# Patient Record
Sex: Female | Born: 1985 | Race: Black or African American | Hispanic: No | Smoking: Never smoker
Health system: Southern US, Community
[De-identification: ages and names within clinical notes are randomized; demographics above are authoritative.]

---

## 2009-10-08 ENCOUNTER — Observation Stay: Payer: Self-pay | Admitting: Obstetrics & Gynecology

## 2009-12-11 ENCOUNTER — Inpatient Hospital Stay: Payer: Self-pay

## 2009-12-30 ENCOUNTER — Emergency Department: Payer: Self-pay | Admitting: Emergency Medicine

## 2014-06-19 ENCOUNTER — Emergency Department: Payer: Self-pay | Admitting: Emergency Medicine

## 2015-04-01 ENCOUNTER — Encounter: Payer: Self-pay | Admitting: Emergency Medicine

## 2015-04-01 ENCOUNTER — Emergency Department
Admission: EM | Admit: 2015-04-01 | Discharge: 2015-04-01 | Disposition: A | Discharge status: Other Acute Inpatient Hospital | Payer: Self-pay | Attending: Emergency Medicine | Admitting: Emergency Medicine

## 2015-04-01 DIAGNOSIS — Y998 Other external cause status: Secondary | ICD-10-CM | POA: Insufficient documentation

## 2015-04-01 DIAGNOSIS — Y9289 Other specified places as the place of occurrence of the external cause: Secondary | ICD-10-CM | POA: Insufficient documentation

## 2015-04-01 DIAGNOSIS — R4583 Excessive crying of child, adolescent or adult: Secondary | ICD-10-CM | POA: Insufficient documentation

## 2015-04-01 DIAGNOSIS — IMO0002 Reserved for concepts with insufficient information to code with codable children: Secondary | ICD-10-CM

## 2015-04-01 DIAGNOSIS — S50812A Abrasion of left forearm, initial encounter: Secondary | ICD-10-CM | POA: Insufficient documentation

## 2015-04-01 DIAGNOSIS — F329 Major depressive disorder, single episode, unspecified: Secondary | ICD-10-CM | POA: Insufficient documentation

## 2015-04-01 DIAGNOSIS — Z23 Encounter for immunization: Secondary | ICD-10-CM | POA: Insufficient documentation

## 2015-04-01 DIAGNOSIS — F32A Depression, unspecified: Secondary | ICD-10-CM

## 2015-04-01 DIAGNOSIS — Y9389 Activity, other specified: Secondary | ICD-10-CM | POA: Insufficient documentation

## 2015-04-01 DIAGNOSIS — X789XXA Intentional self-harm by unspecified sharp object, initial encounter: Secondary | ICD-10-CM | POA: Insufficient documentation

## 2015-04-01 DIAGNOSIS — Z7289 Other problems related to lifestyle: Secondary | ICD-10-CM

## 2015-04-01 LAB — COMPREHENSIVE METABOLIC PANEL
ALBUMIN: 4.3 g/dL (ref 3.5–5.0)
ALK PHOS: 40 U/L (ref 38–126)
ALT: 13 U/L — ABNORMAL LOW (ref 14–54)
ANION GAP: 11 (ref 5–15)
AST: 24 U/L (ref 15–41)
BUN: 14 mg/dL (ref 6–20)
CALCIUM: 8.8 mg/dL — AB (ref 8.9–10.3)
CHLORIDE: 106 mmol/L (ref 101–111)
CO2: 20 mmol/L — AB (ref 22–32)
Creatinine, Ser: 1.05 mg/dL — ABNORMAL HIGH (ref 0.44–1.00)
GFR calc Af Amer: >60 mL/min (ref >60)
GFR calc non Af Amer: >60 mL/min (ref >60)
GLUCOSE: 95 mg/dL (ref 65–99)
POTASSIUM: 3.3 mmol/L — AB (ref 3.5–5.1)
SODIUM: 137 mmol/L (ref 135–145)
Total Bilirubin: 0.6 mg/dL (ref 0.3–1.2)
Total Protein: 7.7 g/dL (ref 6.5–8.1)

## 2015-04-01 LAB — CBC WITH DIFFERENTIAL/PLATELET
BASOS ABS: 0.1 10*3/uL (ref 0–0.1)
BASOS PCT: 1 %
EOS ABS: 0.2 10*3/uL (ref 0–0.7)
EOS PCT: 2 %
HCT: 36.8 % (ref 35.0–47.0)
HEMOGLOBIN: 12.1 g/dL (ref 12.0–16.0)
LYMPHS ABS: 1.7 10*3/uL (ref 1.0–3.6)
Lymphocytes Relative: 21 %
MCH: 29.5 pg (ref 26.0–34.0)
MCHC: 32.7 g/dL (ref 32.0–36.0)
MCV: 90.3 fL (ref 80.0–100.0)
Monocytes Absolute: 0.6 10*3/uL (ref 0.2–0.9)
Monocytes Relative: 7 %
NEUTROS PCT: 69 %
Neutro Abs: 5.6 10*3/uL (ref 1.4–6.5)
PLATELETS: 370 10*3/uL (ref 150–440)
RBC: 4.08 MIL/uL (ref 3.80–5.20)
RDW: 14.1 % (ref 11.5–14.5)
WBC: 8.3 10*3/uL (ref 3.6–11.0)

## 2015-04-01 LAB — SALICYLATE LEVEL

## 2015-04-01 LAB — ETHANOL: Alcohol, Ethyl (B): 62 mg/dL — ABNORMAL HIGH (ref <5)

## 2015-04-01 LAB — ACETAMINOPHEN LEVEL

## 2015-04-01 MED ORDER — TETANUS-DIPHTH-ACELL PERTUSSIS 5-2.5-18.5 LF-MCG/0.5 IM SUSP
0.5000 mL | Freq: Once | INTRAMUSCULAR | Status: AC
  Administered 2015-04-01: 0.5 mL via INTRAMUSCULAR

## 2015-04-01 MED ORDER — TETANUS-DIPHTH-ACELL PERTUSSIS 5-2.5-18.5 LF-MCG/0.5 IM SUSP
INTRAMUSCULAR | Status: AC
  Filled 2015-04-01: qty 0.5

## 2015-04-01 NOTE — ED Notes (Signed)
BEHAVIORAL HEALTH ROUNDING Patient sleeping: Yes.   Patient alert and oriented: not applicable Behavior appropriate: Yes.    Nutrition and fluids offered: No Toileting and hygiene offered: No Sitter present: q15 minute observations and security camera monitoring Law enforcement present: Yes [employer] 
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
## 2015-04-01 NOTE — ED Notes (Signed)
Noted pt tearful, made MD aware. MD acknowledged and no orders received at this time.
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
## 2015-04-01 NOTE — Discharge Instructions (Signed)
Suicidal Feelings: How to Help Yourself °Suicide is the taking of one's own life. If you feel as though life is getting too tough to handle and are thinking about suicide, get help right away. To get help: °· Call your local emergency services (911 in the [place] °· Call a suicide hotline to speak with a trained counselor who understands how you are feeling. The following is a list of suicide hotlines in the [place]. For a list of hotlines in [place], visit www.suicide.org/hotlines/international/[place]-suicide-hotlines.html. °¨  1-800-273-TALK (1-800-273-8255). °¨  1-800-SUICIDE (1-800-784-2433). °¨  1-888-628-9454. This is a hotline for Spanish speakers. °¨  1-800-799-4TTY (1-800-799-4889). This is a hotline for TTY users. °¨  1-866-4-U-TREVOR (1-866-488-7386). This is a hotline for lesbian, gay, bisexual, transgender, or questioning youth. °· Contact a crisis center or a local suicide prevention center. To find a crisis center or suicide prevention center: °¨ Call your local hospital, clinic, community service organization, mental health center, social service provider, or health department. Ask for assistance in connecting to a crisis center. °¨ Visit www.suicidepreventionlifeline.org/getinvolved/locator for a list of crisis centers in the [place], or visit www.suicideprevention.[place]/thinking-about-suicide/find-a-crisis-centre for a list of centers in [place]. °· Visit the following websites: °¨  National Suicide Prevention Lifeline: www.suicidepreventionlifeline.org °¨  Hopeline: www.hopeline.com °¨  American Foundation for Suicide Prevention: www.afsp.org °¨  The Trevor Project (for lesbian, gay, bisexual, transgender, or questioning youth): www.thetrevorproject.org °HOW CAN I HELP MYSELF FEEL BETTER? °· Promise yourself that you will not do anything drastic when you have suicidal feelings. Remember, there is hope. Many people have gotten through suicidal thoughts and feelings, and you will, too. You may  have gotten through them before, and this proves that you can get through them again. °· Let family, friends, teachers, or counselors know how you are feeling. Try not to isolate yourself from those who care about you. Remember, they will want to help you. Talk with someone every day, even if you do not feel sociable. Face-to-face conversation is best. °· Call a mental health professional and see one regularly. °· Visit your primary health care provider every year. °· Eat a well-balanced diet, and space your meals so you eat regularly. °· Get plenty of rest. °· Avoid alcohol and drugs, and remove them from your home. They will only make you feel worse. °· If you are thinking of taking a lot of medicine, give your medicine to someone who can give it to you one day at a time. If you are on antidepressants and are concerned you will overdose, let your health care provider know so he or she can give you safer medicines. Ask your mental health professional about the possible side effects of any medicines you are taking. °· Remove weapons, poisons, knives, and anything else that could harm you from your home. °· Try to stick to routines. Follow a schedule every day. Put self-care on your schedule. °· Make a list of realistic goals, and cross them off when you achieve them. Accomplishments give a sense of worth. °· Wait until you are feeling better before doing the things you find difficult or unpleasant. °· Exercise if you are able. You will feel better if you exercise for even a half hour each day. °· Go out in the sun or into nature. This will help you recover from depression faster. If you have a favorite place to walk, go there. °· Do the things that have always given you pleasure. Play your favorite music, read a good book, paint a picture, play your favorite instrument, or do anything   else that takes your mind off your depression if it is safe to do. °· Keep your living space well lit. °· When you are feeling well,  write yourself a letter about tips and support that you can read when you are not feeling well. °· Remember that life's difficulties can be sorted out with help. Conditions can be treated. You can work on thoughts and strategies that serve you well. °  °This information is not intended to replace advice given to you by your health care provider. Make sure you discuss any questions you have with your health care provider. °  °Document Released: 11/24/2002 Document Revised: 06/10/2014 Document Reviewed: 09/14/2013 °Elsevier Interactive Patient Education ©2016 [employer] ° °

## 2015-04-01 NOTE — ED Notes (Signed)
Pt noted to be tearful but cooperative on assessment. NAD noted at this time. Pt states that she wants her son and that she doesn't want to be here. This RN explained that the psychiatrist needed to see her first. Pt allowed tech to get vitals then calmly walked back to her room. Pt refused shower, denies needs at this time.

## 2015-04-01 NOTE — ED Notes (Signed)
NAD noted at this time. Pt given phone for 10 min phone call. Pt noted to be alert and oriented at this time. Will continue to monitor with 15 min rounds.

## 2015-04-01 NOTE — ED Provider Notes (Signed)
Patient cleared for discharge by psychiatry Dr. Derrick Ravelhalla  Tara Duquette, MD 04/01/15 [phone]

## 2015-04-01 NOTE — ED Notes (Signed)

## 2015-04-01 NOTE — ED Notes (Signed)
NAD noted at this time. Pt given meal tray. Will continue with 15 min checks.

## 2015-04-01 NOTE — ED Provider Notes (Signed)
-----------------------------------------   7:05 AM on 04/01/2015 -----------------------------------------   Blood pressure 130/82, temperature 98.7 F (37.1 C), temperature source Oral, resp. rate 20, height 5\' 2"  (1.575 m), weight 150 lb (68.04 kg), last menstrual period 03/04/2015, SpO2 97 %.  The patient had no acute events since last update.  Calm and cooperative at this time.  Disposition is pending per Psychiatry/Behavioral Medicine team recommendations.     Loleta Roseory Geoffrey Hynes, MD 04/01/15 [phone]

## 2015-04-01 NOTE — ED Notes (Signed)
NAD noted at this time. Pt resting in bed with eyes closed. Respirations noted to be even and unlabored at this time. Lights are noted to be off and TV is on.

## 2015-04-01 NOTE — Consult Note (Signed)
[facility] [facility] Consult   Reason for Consult:  Follow up Referring Physician:  ER Patient Identification: Tara Ford MRN:  [id number] Principal Diagnosis: <principal problem not specified> Diagnosis:  There are no active problems to display for this patient.   Total Time spent with patient: 45 minutes  Subjective:   Tara Ford is a 29 y.o. female patient who came to Hospital after she was found walking away from home. Pt reports that she is single. And has a child and lives with son and his paternal GM along with her grand daughter and they all live in a 3 BR house and pays money. Pt reports that her mother died in 27-Jul-2007 and does not talk to family and got upset and started crying and walked away because of frustration.Marland Kitchen  HPI:  No previous H/O Inpt to psychiatry. No H/O suicide attempts and not being followed by any MH.  Past Psychiatric History: Alcohol and drugs denied. Denies altercation with cousin.  Risk to Self: Suicidal Ideation: No Suicidal Intent: No Is patient at risk for suicide?: No Suicidal Plan?: No Access to Means: No What has been your use of drugs/alcohol within the last 12 months?: N/A How many times?: 0 Other Self Harm Risks: N/a Triggers for Past Attempts: None known Intentional Self Injurious Behavior: Cutting Comment - Self Injurious Behavior: Pt admits to cutting as a coping mechanism Risk to Others: Homicidal Ideation: No Thoughts of Harm to Others: No Current Homicidal Intent: No Current Homicidal Plan: No Access to Homicidal Means: No Identified Victim: N/A History of harm to others?: No Assessment of Violence: None Noted Violent Behavior Description: N/A Does patient have access to weapons?: Yes (Comment) Criminal Charges Pending?: No Does patient have a court date: No Prior Inpatient Therapy: Prior Inpatient Therapy: No Prior Therapy Dates: N/A Prior Therapy Facilty/Provider(s): N/A Reason for Treatment: N/A Prior Outpatient  Therapy: Prior Outpatient Therapy: No Prior Therapy Dates: N/A Prior Therapy Facilty/Provider(s): N/a Reason for Treatment: N/A Does patient have an ACCT team?: No Does patient have Intensive In-House Services?  : No Does patient have [facility] services? : No Does patient have P4CC services?: No  Past Medical History: History reviewed. No pertinent past medical history. History reviewed. No pertinent past surgical history. Family History: History reviewed. No pertinent family history. Family Psychiatric  History: none Social History:  History  Alcohol Use No     History  Drug Use Not on file    Social History   Social History  . Marital Status: Divorced    Spouse Name: N/A  . Number of Children: N/A  . Years of Education: N/A   Social History Main Topics  . Smoking status: Never Smoker   . Smokeless tobacco: None  . Alcohol Use: No  . Drug Use: None  . Sexual Activity: Not Asked   Other Topics Concern  . None   Social History Narrative  . None   Additional Social History:    History of alcohol / drug use?: No history of alcohol / drug abuse                     Allergies:  No Known Allergies  Labs:  Results for orders placed or performed during the hospital encounter of 04/01/15 (from the past 48 hour(s))  Comprehensive metabolic panel     Status: Abnormal   Collection Time: 04/01/15  1:45 AM  Result Value Ref Range   Sodium 137 135 - 145 mmol/L   Potassium 3.3 (  L) 3.5 - 5.1 mmol/L   Chloride 106 101 - 111 mmol/L   CO2 20 (L) 22 - 32 mmol/L   Glucose, Bld 95 65 - 99 mg/dL   BUN 14 6 - 20 mg/dL   Creatinine, Ser 1.05 (H) 0.44 - 1.00 mg/dL   Calcium 8.8 (L) 8.9 - 10.3 mg/dL   Total Protein 7.7 6.5 - 8.1 g/dL   Albumin 4.3 3.5 - 5.0 g/dL   AST 24 15 - 41 U/L   ALT 13 (L) 14 - 54 U/L   Alkaline Phosphatase 40 38 - 126 U/L   Total Bilirubin 0.6 0.3 - 1.2 mg/dL   GFR calc non Af Amer >60 >60 mL/min   GFR calc Af Amer >60 >60 mL/min    Comment:  (NOTE) The eGFR has been calculated using the CKD EPI equation. This calculation has not been validated in all clinical situations. eGFR's persistently <60 mL/min signify possible Chronic Kidney Disease.    Anion gap 11 5 - 15  Ethanol     Status: Abnormal   Collection Time: 04/01/15  1:45 AM  Result Value Ref Range   Alcohol, Ethyl (B) 62 (H) <5 mg/dL    Comment:        LOWEST DETECTABLE LIMIT FOR SERUM ALCOHOL IS 5 mg/dL FOR MEDICAL PURPOSES ONLY   CBC with Diff     Status: None   Collection Time: 04/01/15  1:45 AM  Result Value Ref Range   WBC 8.3 3.6 - 11.0 K/uL   RBC 4.08 3.80 - 5.20 MIL/uL   Hemoglobin 12.1 12.0 - 16.0 g/dL   HCT 36.8 35.0 - 47.0 %   MCV 90.3 80.0 - 100.0 fL   MCH 29.5 26.0 - 34.0 pg   MCHC 32.7 32.0 - 36.0 g/dL   RDW 14.1 11.5 - 14.5 %   Platelets 370 150 - 440 K/uL   Neutrophils Relative % 69 %   Neutro Abs 5.6 1.4 - 6.5 K/uL   Lymphocytes Relative 21 %   Lymphs Abs 1.7 1.0 - 3.6 K/uL   Monocytes Relative 7 %   Monocytes Absolute 0.6 0.2 - 0.9 K/uL   Eosinophils Relative 2 %   Eosinophils Absolute 0.2 0 - 0.7 K/uL   Basophils Relative 1 %   Basophils Absolute 0.1 0 - 0.1 K/uL  Acetaminophen level     Status: Abnormal   Collection Time: 04/01/15  1:45 AM  Result Value Ref Range   Acetaminophen (Tylenol), Serum <10 (L) 10 - 30 ug/mL    Comment:        THERAPEUTIC CONCENTRATIONS VARY SIGNIFICANTLY. A RANGE OF 10-30 ug/mL MAY BE AN EFFECTIVE CONCENTRATION FOR MANY PATIENTS. HOWEVER, SOME ARE BEST TREATED AT CONCENTRATIONS OUTSIDE THIS RANGE. ACETAMINOPHEN CONCENTRATIONS >150 ug/mL AT 4 HOURS AFTER INGESTION AND >50 ug/mL AT 12 HOURS AFTER INGESTION ARE OFTEN ASSOCIATED WITH TOXIC REACTIONS.   Salicylate level     Status: None   Collection Time: 04/01/15  1:45 AM  Result Value Ref Range   Salicylate Lvl <4.4 2.8 - 30.0 mg/dL    No current facility-administered medications for this encounter.   No current outpatient prescriptions on  file.    Musculoskeletal: Strength & Muscle Tone: within normal limits Gait & Station: normal Patient leans: N/A  Psychiatric Specialty Exam: Review of Systems  All other systems reviewed and are negative.   Blood pressure 120/80, pulse 91, temperature 98.1 F (36.7 C), temperature source Oral, resp. rate 18, height $RemoveBe'5\' 2"'fhNGuMnQo$  (1.575 m),  weight 150 lb (68.04 kg), last menstrual period 03/04/2015, SpO2 100 %.Body mass index is 27.43 kg/(m^2).  General Appearance: Casual  Eye Contact::  Fair  Speech:  Clear and Coherent  Volume:  Normal  Mood:  Anxious  Affect:  Appropriate  Thought Process:  Goal Directed  Orientation:  Full (Time, Place, and Person)  Thought Content:  Rumination  Suicidal Thoughts:  No  Homicidal Thoughts:  No  Memory:  Immediate;   Fair Recent;   Fair Remote;   Fair adequate  Judgement:  Fair  Insight:  Fair  Psychomotor Activity:  Normal  Concentration:  Fair  Recall:  [employer] [facility] [place]  Language: Fair  Akathisia:  No  Handed:  Right  AIMS (if indicated):     Assets:  Communication Skills Desire for Improvement Financial Resources/Insurance Housing Social Support Transportation Others:  family.  ADL's:  Intact  Cognition: WNL  Sleep:      Treatment Plan Summary: Plan D/C IVC and discharge pt home and will keep follow up with local [facility] on 04/03/2015  Disposition: No evidence of imminent risk to self or others at present.    Camie Patience K 04/01/2015 3:20 PM

## 2015-04-01 NOTE — ED Notes (Signed)
Report received from RN Luis [facility].

## 2015-04-01 NOTE — ED Notes (Signed)
NAD noted at this time. Pt resting in bed with her eyes closed, TV on and lights off. Will continue to monitor with 15 min safety checks. Respirations even and unlabored.

## 2015-04-01 NOTE — ED Notes (Signed)
NAD noted at this time. Pt resting in bed with

## 2015-04-01 NOTE — ED Notes (Signed)
Patient assigned to appropriate care area. Patient oriented to unit/care area: Informed that, for their safety, care areas are designed for safety and monitored by security cameras at all times; and visiting hours explained to patient. Patient verbalizes understanding, and verbal contract for safety obtained. 

## 2015-04-01 NOTE — ED Notes (Signed)
NAD noted at this time. Pt resting in bed with the TV on and the lights off. Respirations even and unlabored.

## 2015-04-01 NOTE — ED Notes (Signed)
NAD noted at this time. Pt resting in bed with her eyes open and watching TV. Respirations even and unlabored at this time.

## 2015-04-01 NOTE — ED Notes (Signed)
Pt denies comments/concerns at this time. NAD noted at this time. Pt ambulatory to the lobby at this time.

## 2015-04-01 NOTE — ED Notes (Signed)
NAD noted at this time. Pt resting in bed with her eyes closed. Respirations noted to be even and unlabored at this time.

## 2015-04-01 NOTE — ED Notes (Signed)
BEHAVIORAL HEALTH ROUNDING Patient sleeping: Yes.   Patient alert and oriented: UTA d/t pt still sleeping Behavior appropriate: Yes.  ; If no, describe:  Nutrition and fluids offered: Yes  Toileting and hygiene offered: Yes  Sitter present: yes Law enforcement present: Yes [employer]

## 2015-04-01 NOTE — ED Notes (Signed)
Patient assigned to appropriate care area. Patient oriented to unit/care area: Informed that, for their safety, care areas are designed for safety and monitored by security cameras at all times; and visiting hours explained to patient. Patient verbalizes understanding, and verbal contract for safety obtained.  Pt brought into ED BHU via sally port accompanied by [employer] Luis. Pt wanded with metal detector for safety by O[employer] Designer, industrial/productfficer Angela. Patient oriented to unit/care area: Pt informed of unit policies and procedures.  Informed that, for their safety, care areas are designed for safety and monitored by security cameras at all times; and visiting hours explained to patient. Patient verbalizes understanding, and verbal contract for safety obtained.Pt shown to their room #2.  BEHAVIORAL HEALTH ROUNDING Patient sleeping: No. Patient alert and oriented: yes Behavior appropriate: Yes.   Nutrition and fluids offered: Yes  Toileting and hygiene offered: Yes  Sitter present: q15 min observations and security camera monitoring Law enforcement present: Yes [employer]   ENVIRONMENTAL ASSESSMENT Potentially harmful objects out of patient reach: Yes.   Personal belongings secured: Yes.   Patient dressed in hospital provided attire only: Yes.   Plastic bags out of patient reach: Yes.   Patient care equipment removed: Yes.   Equipment and supplies removed: Yes.   Potentially toxic materials out of patient reach: Yes.   Sharps container removed or out of patient reach: Yes.

## 2015-04-01 NOTE — ED Notes (Signed)
Pt in room. No complaints or concerns voiced at this time. No abnormal behavior noted at this time. Will continue to monitor with q15 min checks and security camera monitoring. ODS officer in area. 

## 2015-04-01 NOTE — ED Notes (Signed)
ED BHU PLACEMENT JUSTIFICATION Is the patient under IVC or is there intent for IVC: Yes.   Is the patient medically cleared: Yes.   Is there vacancy in the ED BHU: Yes.   Is the population mix appropriate for patient: Yes.   Is the patient awaiting placement in inpatient or outpatient setting: No. Has the patient had a psychiatric consult: No. Survey of unit performed for contraband, proper placement and condition of furniture, tampering with fixtures in bathroom, shower, and each patient room: Yes.  ; Findings:  APPEARANCE/BEHAVIOR Pt noted to be tearful but cooperative on assessment. NEURO ASSESSMENT Orientation: time, place and person Hallucinations: No.None noted (Hallucinations) Speech: Normal Gait: normal RESPIRATORY ASSESSMENT Normal expansion.  Clear to auscultation.  No rales, rhonchi, or wheezing. CARDIOVASCULAR ASSESSMENT regular rate and rhythm, S1, S2 normal, no murmur, click, rub or gallop GASTROINTESTINAL ASSESSMENT soft, nontender, BS WNL, no r/g EXTREMITIES normal strength, tone, and muscle mass PLAN OF CARE Provide calm/safe environment. Vital signs assessed twice daily. ED BHU Assessment once each 12-hour shift. Collaborate with intake RN daily or as condition indicates. Assure the ED provider has rounded once each shift. Provide and encourage hygiene. Provide redirection as needed. Assess for escalating behavior; address immediately and inform ED provider.  Assess family dynamic and appropriateness for visitation as needed: Yes.  ; If necessary, describe findings:  Educate the patient/family about BHU procedures/visitation: Yes.  ; If necessary, describe findings:

## 2015-10-31 ENCOUNTER — Emergency Department
Admission: EM | Admit: 2015-10-31 | Discharge: 2015-10-31 | Disposition: A | Discharge status: Home / Self Care | Payer: Self-pay | Attending: Emergency Medicine | Admitting: Emergency Medicine

## 2015-10-31 ENCOUNTER — Encounter: Payer: Self-pay | Admitting: Medical Oncology

## 2015-10-31 ENCOUNTER — Emergency Department: Payer: Self-pay

## 2015-10-31 DIAGNOSIS — R0789 Other chest pain: Secondary | ICD-10-CM | POA: Insufficient documentation

## 2015-10-31 DIAGNOSIS — R079 Chest pain, unspecified: Secondary | ICD-10-CM

## 2015-10-31 LAB — BASIC METABOLIC PANEL
ANION GAP: 7 (ref 5–15)
BUN: 13 mg/dL (ref 6–20)
CALCIUM: 9 mg/dL (ref 8.9–10.3)
CO2: 25 mmol/L (ref 22–32)
Chloride: 105 mmol/L (ref 101–111)
Creatinine, Ser: 0.81 mg/dL (ref 0.44–1.00)
GFR calc Af Amer: >60 mL/min (ref >60)
Glucose, Bld: 77 mg/dL (ref 65–99)
POTASSIUM: 3.3 mmol/L — AB (ref 3.5–5.1)
SODIUM: 137 mmol/L (ref 135–145)

## 2015-10-31 LAB — CBC
HEMATOCRIT: 34.9 % — AB (ref 35.0–47.0)
HEMOGLOBIN: 11.7 g/dL — AB (ref 12.0–16.0)
MCH: 30.3 pg (ref 26.0–34.0)
MCHC: 33.6 g/dL (ref 32.0–36.0)
MCV: 90.2 fL (ref 80.0–100.0)
Platelets: 371 10*3/uL (ref 150–440)
RBC: 3.87 MIL/uL (ref 3.80–5.20)
RDW: 14.3 % (ref 11.5–14.5)
WBC: 7.8 10*3/uL (ref 3.6–11.0)

## 2015-10-31 LAB — TROPONIN I

## 2015-10-31 MED ORDER — PANTOPRAZOLE SODIUM 20 MG PO TBEC: 20.0000 mg | DELAYED_RELEASE_TABLET | Freq: Every day | ORAL | Status: AC

## 2015-10-31 MED ORDER — ONDANSETRON HCL 4 MG PO TABS: 4.0000 mg | ORAL_TABLET | Freq: Three times a day (TID) | ORAL | Status: AC | PRN

## 2015-10-31 NOTE — ED Notes (Signed)
Pt reports she began having left sided chest heaviness only when taking breath in about 2 hrs pta. Denies sob.

## 2015-10-31 NOTE — ED Provider Notes (Signed)
Time Seen: Approximately 1850  I have reviewed the triage notes  Chief Complaint: Chest Pain   History of Present Illness: Tara Ford is a 30 y.o. female *who presents with some nausea and some chest discomfort and started this afternoon. Patient states she is feeling somewhat better now than what she had had previously. She states the pain in her chest is more toward the upper left side. She only has it with inspiration. She denies any cough or true shortness of breath. She had some nausea but no persistent vomiting. She denies any significant pulmonary emboli risk factors  History reviewed. No pertinent past medical history.  There are no active problems to display for this patient.   History reviewed. No pertinent past surgical history.  History reviewed. No pertinent past surgical history.  Current Outpatient Rx  Name  Route  Sig  Dispense  Refill  . ondansetron (ZOFRAN) 4 MG tablet   Oral   Take 1 tablet (4 mg total) by mouth every 8 (eight) hours as needed for nausea or vomiting.   12 tablet   1   . pantoprazole (PROTONIX) 20 MG tablet   Oral   Take 1 tablet (20 mg total) by mouth daily.   30 tablet   1     Allergies:  Review of patient's allergies indicates no known allergies.  Family History: No family history on file.  Social History: Social History  Substance Use Topics  . Smoking status: Never Smoker   . Smokeless tobacco: None  . Alcohol Use: No     Review of Systems:   10 point review of systems was performed and was otherwise negative:  Constitutional: No fever Eyes: No visual disturbances ENT: No sore throat, ear pain Cardiac: No Current chest pain Respiratory: No shortness of breath, wheezing, or stridor Abdomen: No abdominal pain, no vomiting, No diarrhea Endocrine: No weight loss, No night sweats Extremities: No peripheral edema, cyanosis Skin: No rashes, easy bruising Neurologic: No focal weakness, trouble with speech or  swollowing Urologic: No dysuria, Hematuria, or urinary frequency   Physical Exam:  ED Triage Vitals  Enc Vitals Group     BP 10/31/15 1616 125/58 mmHg     Pulse Rate 10/31/15 1616 67     Resp 10/31/15 1616 16     Temp 10/31/15 1616 98.7 F (37.1 C)     Temp Source 10/31/15 1616 Oral     SpO2 10/31/15 1616 98 %     Weight 10/31/15 1614 165 lb (74.844 kg)     Height 10/31/15 1614  (1.575 m)     Head Cir --      Peak Flow --      Pain Score 10/31/15 1615 6     Pain Loc --      Pain Edu? --      Excl. in GC? --     General: Awake , Alert , and Oriented times 3; GCS 15 Head: Normal cephalic , atraumatic Eyes: Pupils equal , round, reactive to light Nose/Throat: No nasal drainage, patent upper airway without erythema or exudate.  Neck: Supple, Full range of motion, No anterior adenopathy or palpable thyroid masses Lungs: Clear to ascultation without wheezes , rhonchi, or rales Heart: Regular rate, regular rhythm without murmurs , gallops , or rubs Abdomen: Tender in the epigastric area without rebound, guarding , or rigidity; bowel sounds positive and symmetric in all 4 quadrants. No organomegaly .        Extremities:  2 plus symmetric pulses. No edema, clubbing or cyanosis Neurologic: normal ambulation, Motor symmetric without deficits, sensory intact Skin: warm, dry, no rashes   Labs:   All laboratory work was reviewed including any pertinent negatives or positives listed below:  Labs Reviewed  BASIC METABOLIC PANEL - Abnormal; Notable for the following:    Potassium 3.3 (*)    All other components within normal limits  CBC - Abnormal; Notable for the following:    Hemoglobin 11.7 (*)    HCT 34.9 (*)    All other components within normal limits  TROPONIN I  Laboratory work was reviewed and showed no clinically significant abnormalities.   EKG:  ED ECG REPORT I, Jennye MoccasinBrian S Morrissa Shein, the attending physician, personally viewed and interpreted this ECG.  Date:  10/31/2015 EKG Time: 1617 Rate 62 Rhythm: normal sinus rhythm QRS Axis: normal Intervals: normal ST/T Wave abnormalities: normal Conduction Disturbances: none Narrative Interpretation: unremarkable *Normal EKG   Radiology:       DG Chest 2 View (Final result) Result time: 10/31/15 16:50:56   Final result by Rad Results In Interface (10/31/15 16:50:56)   Narrative:   CLINICAL DATA: Left-sided chest pain, worse with inhalation.  EXAM: CHEST 2 VIEW  COMPARISON: None.  FINDINGS: Normal heart size and mediastinal contours. No acute infiltrate or edema. No effusion or pneumothorax. No osseous findings.  IMPRESSION: Negative chest.   Electronically Signed By: Marnee SpringJonathon Watts M.D. On: 10/31/2015 16:50          I personally reviewed the radiologic studies   P ED Course: * Differential includes all life-threatening causes for chest pain. This includes but is not exclusive to acute coronary syndrome, aortic dissection, pulmonary embolism, cardiac tamponade, community-acquired pneumonia, pericarditis, musculoskeletal chest wall pain, etc. Given the patient's current clinical presentation and objective findings I felt that this may be some reflux as she seemed to have some tenderness in the epigastric area. I felt this was unlikely to be a life-threatening cause for her chest discomfort and the patient was prescribed Zofran and an antacid medication on an outpatient basis.    Assessment: * Chest pain syndrome   Final Clinical Impression:   Final diagnoses:  Chest pain syndrome     Plan: Outpatient management Patient was advised to return immediately if condition worsens. Patient was advised to follow up with their primary care physician or other specialized physicians involved in their outpatient care. The patient and/or family member/power of attorney had laboratory results reviewed at the bedside. All questions and concerns were addressed and  appropriate discharge instructions were distributed by the nursing staff.            Jennye MoccasinBrian S Lera Gaines, MD 10/31/15 2213

## 2015-10-31 NOTE — ED Notes (Signed)
Pt presents with nausea since this morning and chest pains that began this afternoon. Pt took aspirin 81 mg before coming and states pain has somewhat subsided. States chest hurts in upper left side, only upon inspiration. Denies cough. Pt states nausea is still present, but somewhat resolved. Pt has been sleeping most of the day. Alert & oriented with NAD noted.

## 2016-11-18 ENCOUNTER — Emergency Department
Admission: EM | Admit: 2016-11-18 | Discharge: 2016-11-18 | Disposition: A | Discharge status: Home / Self Care | Payer: Self-pay | Attending: Emergency Medicine | Admitting: Emergency Medicine

## 2016-11-18 ENCOUNTER — Encounter: Payer: Self-pay | Admitting: Emergency Medicine

## 2016-11-18 ENCOUNTER — Emergency Department: Payer: Self-pay

## 2016-11-18 DIAGNOSIS — R52 Pain, unspecified: Secondary | ICD-10-CM

## 2016-11-18 DIAGNOSIS — Y9389 Activity, other specified: Secondary | ICD-10-CM | POA: Insufficient documentation

## 2016-11-18 DIAGNOSIS — S0083XA Contusion of other part of head, initial encounter: Secondary | ICD-10-CM | POA: Insufficient documentation

## 2016-11-18 DIAGNOSIS — W19XXXA Unspecified fall, initial encounter: Secondary | ICD-10-CM

## 2016-11-18 DIAGNOSIS — Y999 Unspecified external cause status: Secondary | ICD-10-CM | POA: Insufficient documentation

## 2016-11-18 DIAGNOSIS — W01198A Fall on same level from slipping, tripping and stumbling with subsequent striking against other object, initial encounter: Secondary | ICD-10-CM | POA: Insufficient documentation

## 2016-11-18 DIAGNOSIS — T1490XA Injury, unspecified, initial encounter: Secondary | ICD-10-CM

## 2016-11-18 DIAGNOSIS — Y92009 Unspecified place in unspecified non-institutional (private) residence as the place of occurrence of the external cause: Secondary | ICD-10-CM | POA: Insufficient documentation

## 2016-11-18 LAB — POCT PREGNANCY, URINE: PREG TEST UR: NEGATIVE

## 2016-11-18 MED ORDER — NAPROXEN 500 MG PO TABS: 500.0000 mg | ORAL_TABLET | Freq: Two times a day (BID) | ORAL | 0 refills | Status: DC

## 2016-11-18 NOTE — Discharge Instructions (Signed)
Begin taking naproxen 500 mg twice a day with food. Eat soft foods until you're able to eat regularly to avoid increased pain in your  jaw. Use ice to your jaw as needed for pain. [facility] if any continued problems.

## 2016-11-18 NOTE — ED Triage Notes (Signed)
Patient presents to the ED with jaw pain post fall yesterday.  Patient states she tripped and fell inside and hit her face on the floor.  Patient states she fell around 7pm in the evening.  Patient is tearful during triage.  Details of fall are very vague.

## 2016-11-18 NOTE — ED Notes (Signed)
See triage note  States she fell yesterday  Having pain to left jaw area  Increased pain with opening and closing mouth  Speaking clearly

## 2016-11-18 NOTE — ED Provider Notes (Signed)
[facility] Emergency Department Provider Note  ____________________________________________   First MD Initiated Contact with Patient 11/18/16 [phone]     (approximate)  I have reviewed the triage vital signs and the nursing notes.   HISTORY  Chief Complaint Fall    HPI Tara Ford is a 31 y.o. female is here with complaint of left-sided mandible pain. Patient states that she was rushing around inside her house yesterday and tripped. She states she fell striking her face on the floor. She states this happened around 7 PM yesterday. She denies any head injury or loss of consciousness. She is not taking any over-the-counter medication for pain. She has not been able to eat solid food due to the pain in her jaw. Currently she rates her pain as an 8 out of 10.   History reviewed. No pertinent past medical history.  There are no active problems to display for this patient.   History reviewed. No pertinent surgical history.  Prior to Admission medications   Medication Sig Start Date End Date Taking? Authorizing Provider  naproxen (NAPROSYN) 500 MG tablet Take 1 tablet (500 mg total) by mouth 2 (two) times daily with a meal. 11/18/16   Bridget Hartshorn L, PA-C  pantoprazole (PROTONIX) 20 MG tablet Take 1 tablet (20 mg total) by mouth daily. 10/31/15 10/30/16  Jennye Moccasin, MD    Allergies Patient has no known allergies.  No family history on file.  Social History Social History  Substance Use Topics  . Smoking status: Never Smoker  . Smokeless tobacco: Never Used  . Alcohol use No    Review of Systems Constitutional: No fever/chills Eyes: No visual changes. ENT: Pain left mandible. Cardiovascular: Denies chest pain. Respiratory: Denies shortness of breath. Gastrointestinal:   No nausea, no vomiting. Musculoskeletal: Negative for back pain. Skin: Negative for rash. Neurological: Negative for headaches, focal weakness or  numbness.   ____________________________________________   PHYSICAL EXAM:  VITAL SIGNS: ED Triage Vitals  Enc Vitals Group     BP 11/18/16 0849 125/75     Pulse Rate 11/18/16 0849 91     Resp 11/18/16 0849 18     Temp 11/18/16 0849 98.9 F (37.2 C)     Temp Source 11/18/16 0849 Oral     SpO2 11/18/16 0849 98 %     Weight 11/18/16 0844 156 lb (70.8 kg)     Height 11/18/16 0844 5\' 2"  (1.575 m)     Head Circumference --      Peak Flow --      Pain Score 11/18/16 0844 8     Pain Loc --      Pain Edu? --      Excl. in GC? --     Constitutional: Alert and oriented. Well appearing and in no acute distress. Eyes: Conjunctivae are normal. PERRL. EOMI. Head: Atraumatic. Nose: No congestion/rhinnorhea. Mouth/Throat: Mucous membranes are moist.  Oropharynx non-erythematous. No trauma noted to the teeth. There is moderate tenderness on palpation of the left lateral mandible and patient is unable to open her mouth completely secondary to pain in that area. There is no gross deformity noted and minimal soft tissue swelling. No ecchymosis or abrasions were seen. Neck: No stridor. No cervical tenderness on palpation posteriorly. Range of motion is within normal limits.  Cardiovascular: Normal rate, regular rhythm. Grossly normal heart sounds.  Good peripheral circulation. Respiratory: Normal respiratory effort.  No retractions. Lungs CTAB. Musculoskeletal: Both upper and lower extremities without any difficulty. Normal  gait was noted. Neurologic:  Normal speech and language. No gross focal neurologic deficits are appreciated. No gait instability. Skin:  Skin is warm, dry and intact. No ecchymosis, abrasions or erythema was noted. Psychiatric: Mood and affect are normal. Speech and behavior are normal.  ____________________________________________   LABS (all labs ordered are listed, but only abnormal results are displayed)  Labs Reviewed  POC URINE PREG, ED  POCT PREGNANCY, URINE     RADIOLOGY  Ct Maxillofacial Wo Contrast  Result Date: 11/18/2016 CLINICAL DATA:  Jaw pain after fall yesterday. EXAM: CT MAXILLOFACIAL WITHOUT CONTRAST TECHNIQUE: Multidetector CT imaging of the maxillofacial structures was performed. Multiplanar CT image reconstructions were also generated. A small metallic BB was placed on the right temple in order to reliably differentiate right from left. COMPARISON:  None. FINDINGS: Osseous: No fracture or mandibular dislocation. No destructive process. Orbits: Negative. No traumatic or inflammatory finding. Sinuses: Clear. Soft tissues: Negative. Limited intracranial: No significant or unexpected finding. IMPRESSION: No definite abnormality seen in the maxillofacial region. Electronically Signed   By: Lupita RaiderJames  Green Jr, M.D.   On: 11/18/2016 10:07    ____________________________________________   PROCEDURES  Procedure(s) performed: None  Procedures  Critical Care performed: No  ____________________________________________   INITIAL IMPRESSION / ASSESSMENT AND PLAN / ED COURSE  Pertinent labs & imaging results that were available during my care of the patient were reviewed by me and considered in my medical decision making (see chart for details).  Patient was told that her x-ray did not show any fractures. She was placed on a soft diet until she is able in without any pain. She is given a prescription for naproxen 500 mg twice a day with food. She may use ice to her jaw as needed for discomfort. [facility] if any continued problems.      ____________________________________________   FINAL CLINICAL IMPRESSION(S) / ED DIAGNOSES  Final diagnoses:  Injury  Pain  Contusion of face, initial encounter  Fall in home, initial encounter      NEW MEDICATIONS STARTED DURING THIS VISIT:  Discharge Medication List as of 11/18/2016 10:27 AM    START taking these medications   Details  naproxen  (NAPROSYN) 500 MG tablet Take 1 tablet (500 mg total) by mouth 2 (two) times daily with a meal., Starting Mon 11/18/2016, Print         Note:  This document was prepared using Dragon voice recognition software and may include unintentional dictation errors.    Tommi RumpsSummers, Eddi Hymes L, PA-C 11/18/16 1040    Minna AntisPaduchowski, Kevin, MD 11/18/16 1351

## 2017-06-14 ENCOUNTER — Emergency Department: Payer: Self-pay

## 2017-06-14 ENCOUNTER — Other Ambulatory Visit: Payer: Self-pay

## 2017-06-14 ENCOUNTER — Encounter: Payer: Self-pay | Admitting: Emergency Medicine

## 2017-06-14 ENCOUNTER — Emergency Department
Admission: EM | Admit: 2017-06-14 | Discharge: 2017-06-14 | Disposition: A | Discharge status: Home / Self Care | Payer: Self-pay | Attending: Emergency Medicine | Admitting: Emergency Medicine

## 2017-06-14 DIAGNOSIS — Z79899 Other long term (current) drug therapy: Secondary | ICD-10-CM | POA: Insufficient documentation

## 2017-06-14 DIAGNOSIS — J069 Acute upper respiratory infection, unspecified: Secondary | ICD-10-CM | POA: Insufficient documentation

## 2017-06-14 DIAGNOSIS — R0789 Other chest pain: Secondary | ICD-10-CM | POA: Insufficient documentation

## 2017-06-14 DIAGNOSIS — B9789 Other viral agents as the cause of diseases classified elsewhere: Secondary | ICD-10-CM | POA: Insufficient documentation

## 2017-06-14 MED ORDER — PSEUDOEPH-BROMPHEN-DM 30-2-10 MG/5ML PO SYRP: 10.0000 mL | ORAL_SOLUTION | Freq: Four times a day (QID) | ORAL | 0 refills | Status: AC | PRN

## 2017-06-14 MED ORDER — FLUTICASONE PROPIONATE 50 MCG/ACT NA SUSP: 1.0000 | Freq: Two times a day (BID) | NASAL | 0 refills | Status: AC

## 2017-06-14 NOTE — ED Triage Notes (Signed)
Pt c/o cough and congestion, pt c/o nonproductive cough, runny nose and sneezing.  No OTC medication used no tylenol or ibuprofen taking

## 2017-06-14 NOTE — ED Provider Notes (Signed)
Lurline IdolI, Juanmanuel Marohl, attending physician, personally viewed and interpreted this EKG  EKG Time: 1650 Rate: 60 Rhythm: normal sinus rhythm Axis: normal Intervals: qtc 418 QRS: narrow ST changes: no st elevation Impression: normal ekg    Phineas SemenGoodman, Philipe Laswell, MD 06/14/17 1657

## 2017-06-14 NOTE — ED Notes (Signed)
Pt reports cough x 4 days, reports this morning had increasing chest pain with the cough, states pain is reproducible with deep breathing and coughing. Pt also c/o nasal congestion at this time.

## 2017-06-14 NOTE — ED Provider Notes (Signed)
[facility] Emergency Department Provider Note  ____________________________________________  Time seen: Approximately 4:34 PM  I have reviewed the triage vital signs and the nursing notes.   HISTORY  Chief Complaint Cough and Nasal Congestion    HPI Tara Ford is a 32 y.o. female who presents the emergency department complaining of nasal congestion, malaise, cough, sore throat, chest pain.  Patient reports that symptoms began last night.  This morning, patient woke up and did not "feel good."  Patient has had increased nasal congestion, sore throat, cough today.  Patient reports that when she "got up to go to work" she developed "chest pain".  Patient presents the emergency department at this time.  She has not tried any medications including Tylenol, Motrin, cough and cold medications.  No cardiac history.  No familial history of significant cardiac issues.  Patient reports that pain is more along the anterior chest wall.  She reports it is worse with deep inspiration or expiration or palpation of her chest.  History reviewed. No pertinent past medical history.  There are no active problems to display for this patient.   History reviewed. No pertinent surgical history.  Prior to Admission medications   Medication Sig Start Date End Date Taking? Authorizing Provider  brompheniramine-pseudoephedrine-DM 30-2-10 MG/5ML syrup Take 10 mLs by mouth 4 (four) times daily as needed. 06/14/17   Merwyn Hodapp, Delorise Royals, PA-C  fluticasone (FLONASE) 50 MCG/ACT nasal spray Place 1 spray into both nostrils 2 (two) times daily. 06/14/17   Ermie Glendenning, Delorise Royals, PA-C  naproxen (NAPROSYN) 500 MG tablet Take 1 tablet (500 mg total) by mouth 2 (two) times daily with a meal. 11/18/16   Bridget Hartshorn L, PA-C  pantoprazole (PROTONIX) 20 MG tablet Take 1 tablet (20 mg total) by mouth daily. 10/31/15 10/30/16  Jennye Moccasin, MD    Allergies Patient has no known allergies.  History  reviewed. No pertinent family history.  Social History Social History   Tobacco Use  . Smoking status: Never Smoker  . Smokeless tobacco: Never Used  Substance Use Topics  . Alcohol use: No  . Drug use: No     Review of Systems  Constitutional: No fever/chills Eyes: No visual changes. No discharge ENT: Positive for nasal congestion and sore throat Cardiovascular: Positive chest pain. Respiratory: Positive cough. No SOB. Gastrointestinal: No abdominal pain.  No nausea, no vomiting.   Musculoskeletal: Negative for musculoskeletal pain. Skin: Negative for rash, abrasions, lacerations, ecchymosis. Neurological: Negative for headaches, focal weakness or numbness. 10-point ROS otherwise negative.  ____________________________________________   PHYSICAL EXAM:  VITAL SIGNS: ED Triage Vitals [06/14/17 1551]  Enc Vitals Group     BP 133/73     Pulse Rate 76     Resp 18     Temp 98.7 F (37.1 C)     Temp Source Oral     SpO2 98 %     Weight 150 lb (68 kg)     Height 5\' 2"  (1.575 m)     Head Circumference      Peak Flow      Pain Score 7     Pain Loc      Pain Edu?      Excl. in GC?      Constitutional: Alert and oriented. Well appearing and in no acute distress. Eyes: Conjunctivae are normal. PERRL. EOMI. Head: Atraumatic. ENT:      Ears: EACs and TMs unremarkable bilaterally.      Nose: Moderate congestion/rhinnorhea.  Minutes  are erythematous and edematous.      Mouth/Throat: Mucous membranes are moist.  Oropharynx is erythematous but nonedematous.  Tonsils are mildly erythematous but nonedematous and no exudates.  Uvula is midline. Neck: No stridor.  Supple full range of motion Hematological/Lymphatic/Immunilogical: No cervical lymphadenopathy. Cardiovascular: Normal rate, regular rhythm.  No murmurs, rubs, gallops.  Normal S1 and S2.  Good peripheral circulation. Respiratory: Normal respiratory effort without tachypnea or retractions. Lungs CTAB. Good air entry  to the bases with no decreased or absent breath sounds. Musculoskeletal: Full range of motion to all extremities. No gross deformities appreciated.  Patient is tender to palpation over bilateral margins of the sternum.  Pain is reproduced with palpation.  No palpable abnormality. Neurologic:  Normal speech and language. No gross focal neurologic deficits are appreciated.  Skin:  Skin is warm, dry and intact. No rash noted. Psychiatric: Mood and affect are normal. Speech and behavior are normal. Patient exhibits appropriate insight and judgement.   ____________________________________________   LABS (all labs ordered are listed, but only abnormal results are displayed)  Labs Reviewed - No data to display ____________________________________________  EKG  ED ECG REPORT I, Delorise RoyalsJonathan D Demontay Grantham,  personally viewed and interpreted this ECG.   Date: June 14, 2017  EKG Time: 1650 hrs.  Rate: 60 bpm  Rhythm: normal sinus rhythm, with sinus arrhythmia  Axis: Normal  Intervals:none  ST&T Change: No ST elevations or depressions noted.  Normal EKG.  ____________________________________________  RADIOLOGY Festus BarrenI, Emelynn Rance D Sergio Hobart, personally viewed and evaluated these images (plain radiographs) as part of my medical decision making, as well as reviewing the written report by the radiologist.  Dg Chest 2 View  Result Date: 06/14/2017 CLINICAL DATA:  Cough for 4 days EXAM: CHEST  2 VIEW COMPARISON:  Oct 31, 2015 FINDINGS: The heart size and mediastinal contours are within normal limits. Calcified granuloma is identified in the medial right lung base unchanged. There is no focal infiltrate, pulmonary edema, or pleural effusion. The visualized skeletal structures are unremarkable. IMPRESSION: No active cardiopulmonary disease. Electronically Signed   By: Sherian ReinWei-Chen  Lin M.D.   On: 06/14/2017 17:30    ____________________________________________    PROCEDURES  Procedure(s) performed:     Procedures    Medications - No data to display   ____________________________________________   INITIAL IMPRESSION / ASSESSMENT AND PLAN / ED COURSE  Pertinent labs & imaging results that were available during my care of the patient were reviewed by me and considered in my medical decision making (see chart for details).  Review of the [place] CSRS was performed in accordance of the [facility] prior to dispensing any controlled drugs.  Clinical Course as of Jun 14 1742  Sat Jun 14, 2017  1649 Patient presented to the emergency department with nasal congestion, sore throat, cough, malaise.  Patient also endorsed chest pain with inspiration, expiration, palpation of the anterior chest wall.  At this time, I feel that symptoms are most consistent with viral URI.  Patient has no cardiac history.  EKG and chest x-ray are ordered for evaluation of patient's chest pain complaint.  These returned with reassuring results, I feel there is no indication for further cardiac workup.  [JC]    Clinical Course User Index [JC] Myriah Boggus, Delorise RoyalsJonathan D, PA-C    Patient's diagnosis is consistent with viral respiratory infection and chest wall pain.  Patient presented with cough and pain with inspiration and expiration and palpation of her chest wall.  EKG and chest x-ray revealed no acute  abnormalities.  Symptoms are most consistent with viral respiratory infection with chest wall tenderness.. Patient will be discharged home with prescriptions for Flonase and Bromfed cough syrup.  She is to take Tylenol Motrin at home.. Patient is to follow up with primary care as needed or otherwise directed. Patient is given ED precautions to return to the ED for any worsening or new symptoms.     ____________________________________________  FINAL CLINICAL IMPRESSION(S) / ED DIAGNOSES  Final diagnoses:  Viral upper respiratory tract infection  Chest wall pain      NEW MEDICATIONS STARTED DURING THIS VISIT:  ED  Discharge Orders        Ordered    fluticasone (FLONASE) 50 MCG/ACT nasal spray  2 times daily     06/14/17 1743    brompheniramine-pseudoephedrine-DM 30-2-10 MG/5ML syrup  4 times daily PRN     06/14/17 1743          This chart was dictated using voice recognition software/Dragon. Despite best efforts to proofread, errors can occur which can change the meaning. Any change was purely unintentional.    Racheal Patches, PA-C 06/14/17 1744    Sharman Cheek, MD 06/14/17 [phone]

## 2017-06-14 NOTE — ED Notes (Signed)

## 2018-04-22 IMAGING — CT CT MAXILLOFACIAL W/O CM
3 series · 17 of 47 positions shown, 20 images · non-contrast
Comparison: None.

CLINICAL DATA: Jaw pain after fall yesterday.

EXAM:
CT MAXILLOFACIAL WITHOUT CONTRAST
TECHNIQUE: Multidetector CT imaging of the maxillofacial structures was
performed. Multiplanar CT image reconstructions were also generated.
A small metallic BB was placed on the right temple in order to
reliably differentiate right from left.

[Series 2: max soft · axial · 0.32mm/px · z∈[+77,+211]mm · 11 of 79 slices shown, 14 images]
[im 6/79  brain]
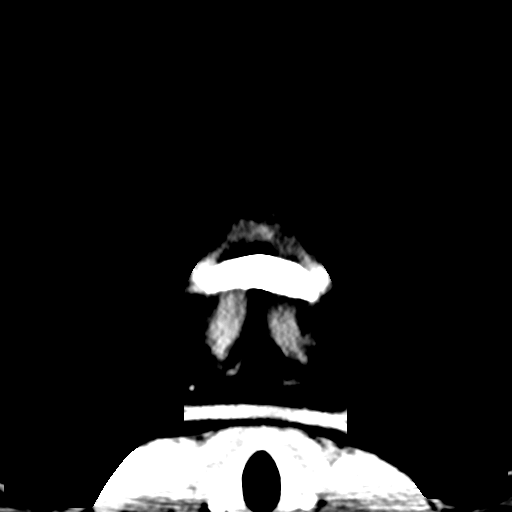
[im 6/79  bone]
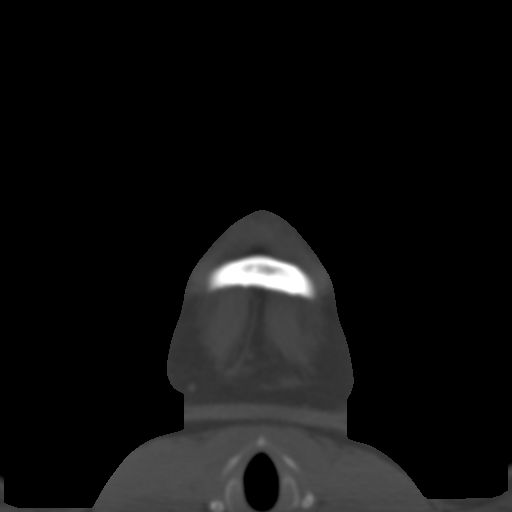
[im 11/79  bone]
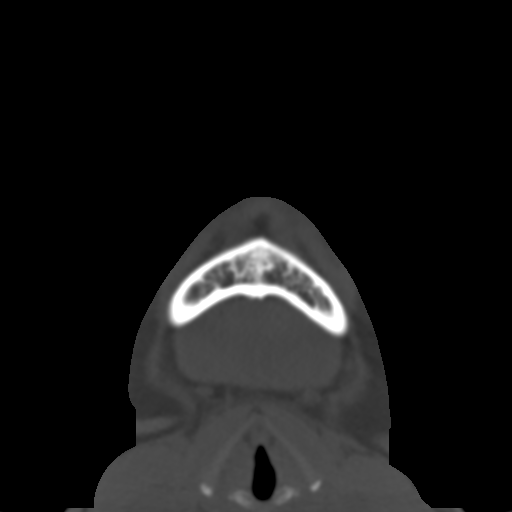
[im 19/79  bone]
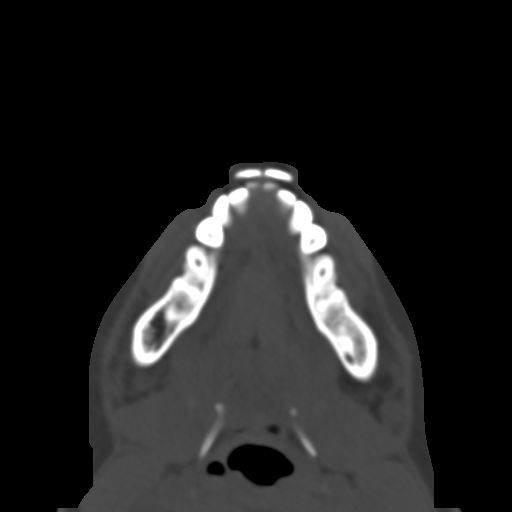
[im 25/79  bone]
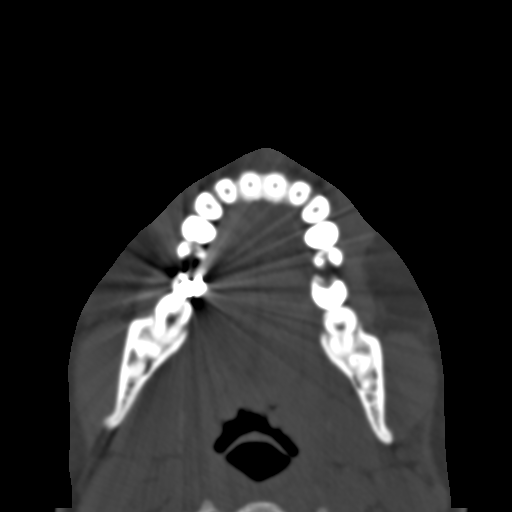
[im 33/79  brain]
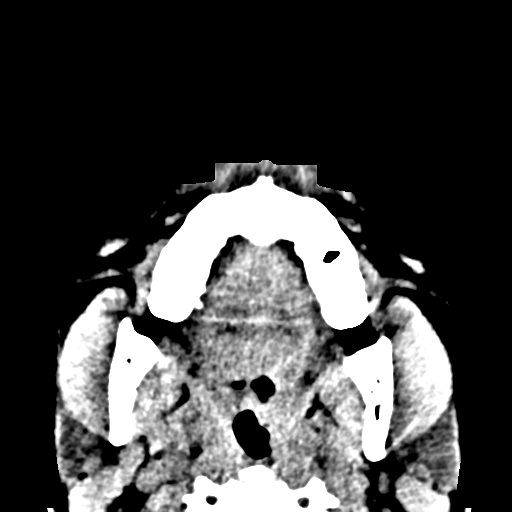
[im 33/79  bone]
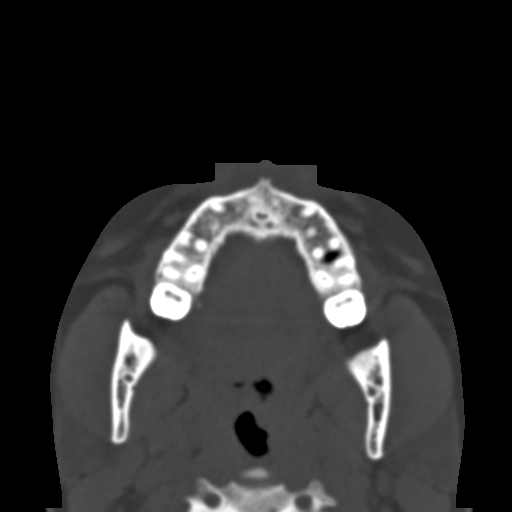
[im 41/79  bone]
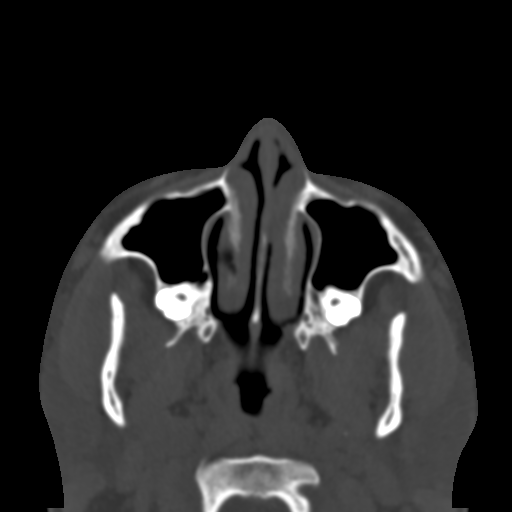
[im 46/79  bone]
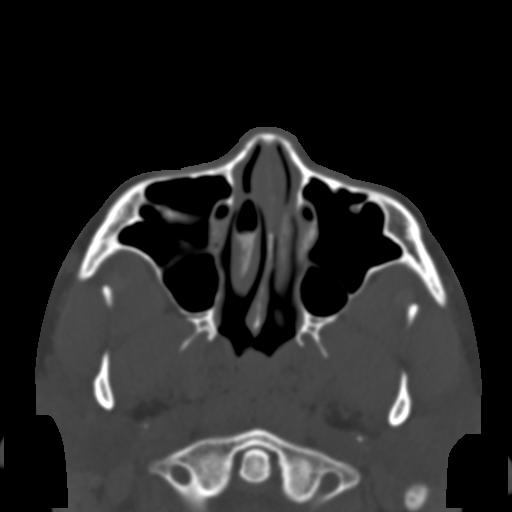
[im 54/79  bone]
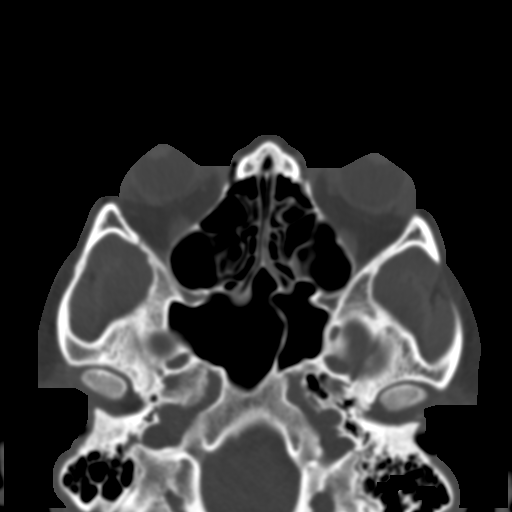
[im 60/79  brain]
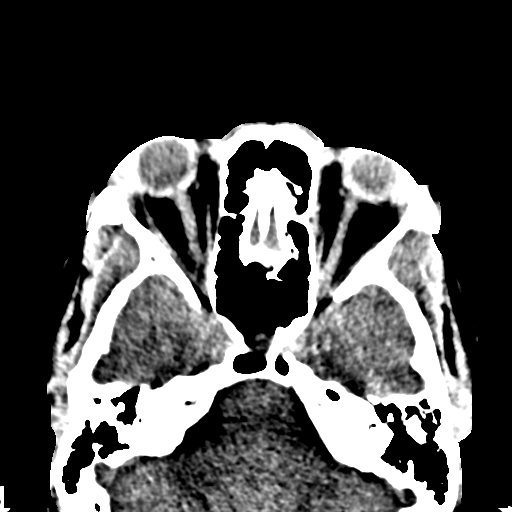
[im 60/79  bone]
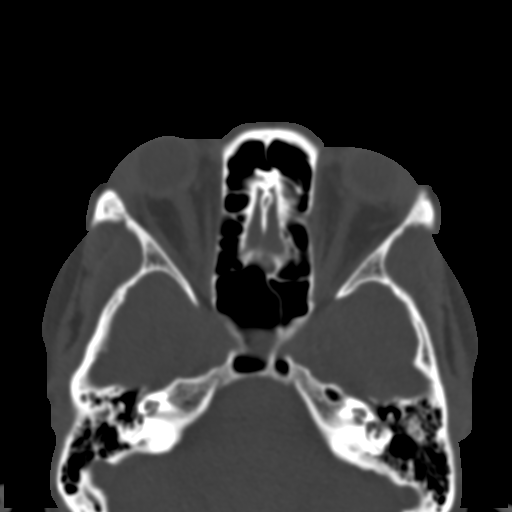
[im 68/79  bone]
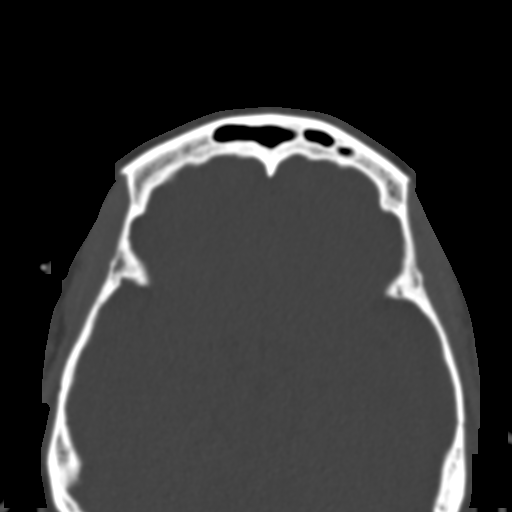
[im 73/79  bone]
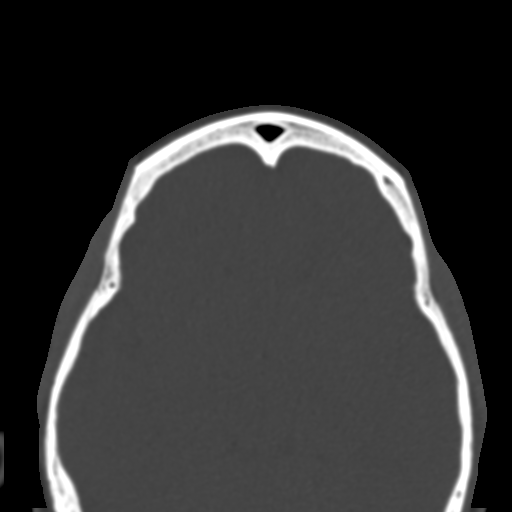

[Series 6: coronal soft · coronal · 0.31mm/px · 3 of 73 slices shown]
[im 25/73  bone]
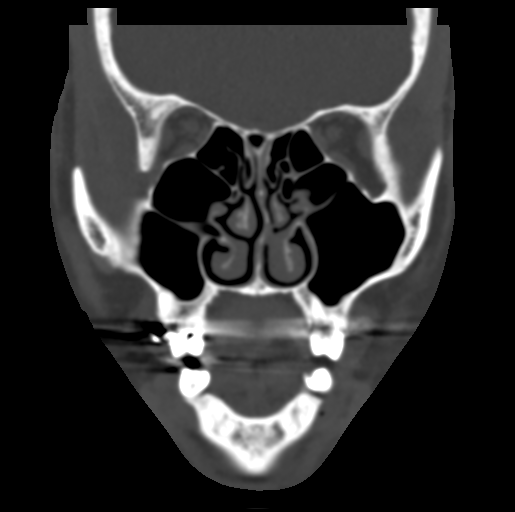
[im 33/73  bone]
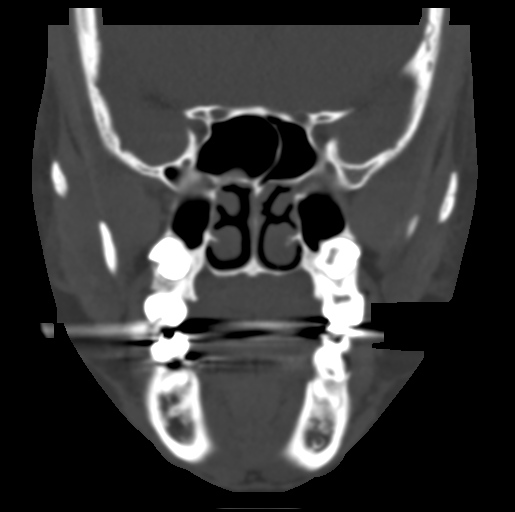
[im 41/73  bone]
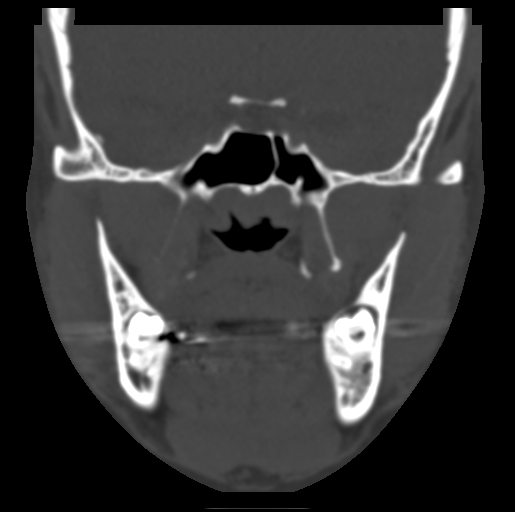

[Series 7: sagittal soft · sagittal · 0.32mm/px · 3 of 79 slices shown]
[im 27/79  bone]
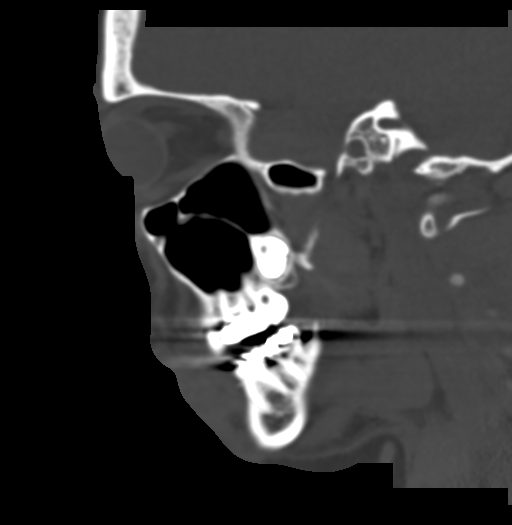
[im 40/79  bone]
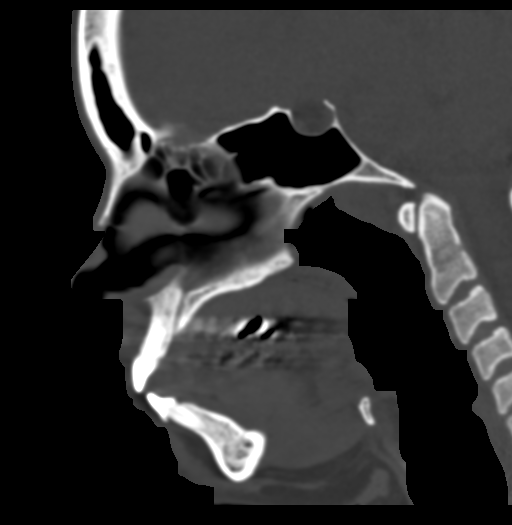
[im 53/79  bone]
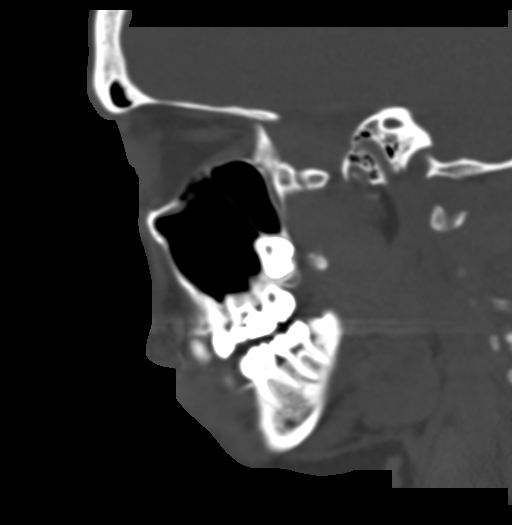

[17 of 47 positions shown; findings below may reference images not displayed]

FINDINGS: Osseous: No fracture or mandibular dislocation. No destructive
process.

Orbits: Negative. No traumatic or inflammatory finding.

Sinuses: Clear.

Soft tissues: Negative.

Limited intracranial: No significant or unexpected finding.
IMPRESSION: No definite abnormality seen in the maxillofacial region.

## 2018-11-16 IMAGING — CR DG CHEST 2V
1 series · 2 of 2 positions shown · non-contrast
Comparison: October 31, 2015

CLINICAL DATA: Cough for 4 days

EXAM:
CHEST  2 VIEW

[Series 1: dg chest 2 view · 0.14mm/px · 2 of 2 slices shown]
[im 1/2]
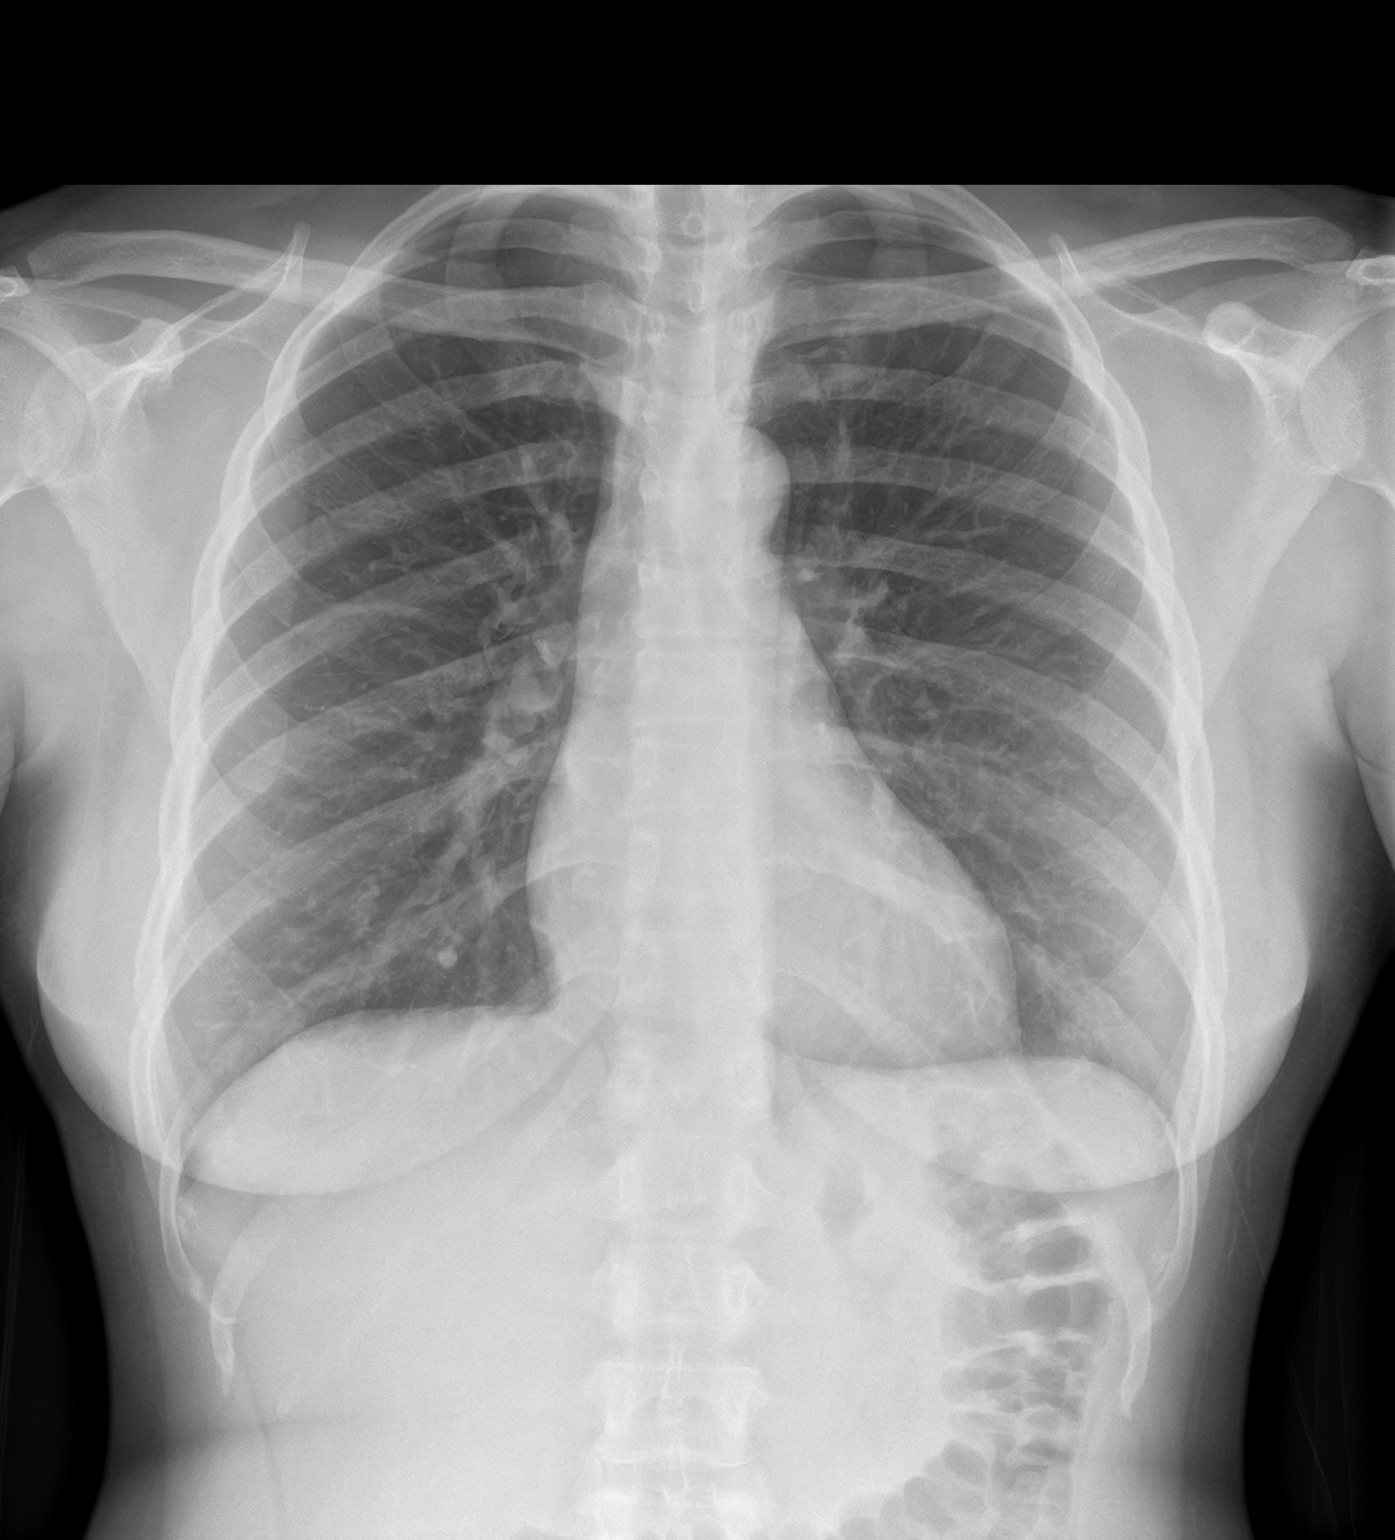
[im 2/2]
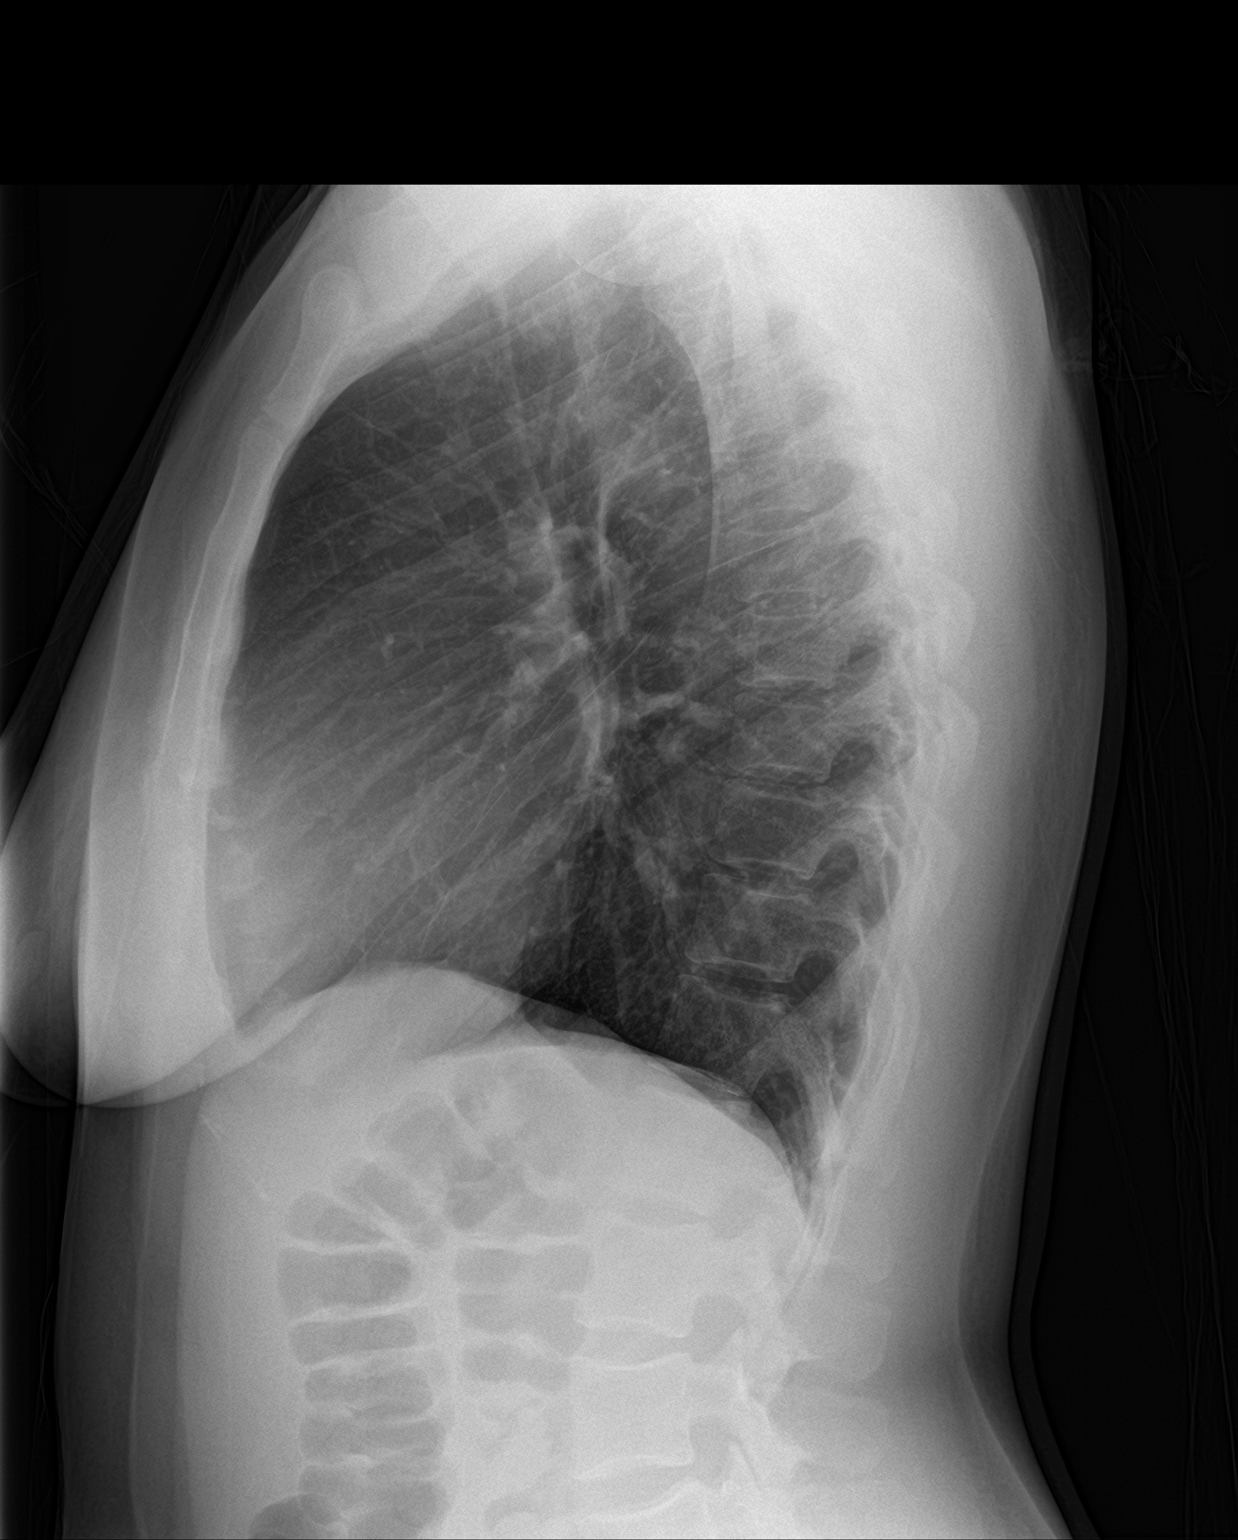

[2 of 2 positions shown; findings below may reference images not displayed]

FINDINGS: The heart size and mediastinal contours are within normal limits.
Calcified granuloma is identified in the medial right lung base
unchanged. There is no focal infiltrate, pulmonary edema, or pleural
effusion. The visualized skeletal structures are unremarkable.
IMPRESSION: No active cardiopulmonary disease.

## 2021-03-02 ENCOUNTER — Other Ambulatory Visit: Payer: Self-pay

## 2021-03-02 ENCOUNTER — Emergency Department
Admission: EM | Admit: 2021-03-02 | Discharge: 2021-03-02 | Disposition: A | Discharge status: Home / Self Care | Payer: Medicaid Other | Attending: Emergency Medicine | Admitting: Emergency Medicine

## 2021-03-02 DIAGNOSIS — R0981 Nasal congestion: Secondary | ICD-10-CM | POA: Insufficient documentation

## 2021-03-02 DIAGNOSIS — H6993 Unspecified Eustachian tube disorder, bilateral: Secondary | ICD-10-CM | POA: Insufficient documentation

## 2021-03-02 DIAGNOSIS — H6983 Other specified disorders of Eustachian tube, bilateral: Secondary | ICD-10-CM

## 2021-03-02 MED ORDER — METHYLPREDNISOLONE 4 MG PO TBPK: ORAL_TABLET | ORAL | 0 refills | Status: AC

## 2021-03-02 MED ORDER — FEXOFENADINE-PSEUDOEPHED ER 60-120 MG PO TB12: 1.0000 | ORAL_TABLET | Freq: Two times a day (BID) | ORAL | 0 refills | Status: AC

## 2021-03-02 NOTE — ED Provider Notes (Signed)
[facility] Emergency Department Provider Note   ____________________________________________   Event Date/Time   First MD Initiated Contact with Patient 03/02/21 1042     (approximate)  I have reviewed the triage vital signs and the nursing notes.   HISTORY  Chief Complaint Ear Pain and Sinus Problem    HPI Tara Ford is a 35 y.o. female patient complain of sinus congestion and bilateral ear pain.  Patient denies hearing loss or vertigo.  Patient denies fever associated complaint.  Denies recent travel or known contact with COVID-19.         No past medical history on file.  There are no problems to display for this patient.   History reviewed. No pertinent surgical history.  Prior to Admission medications   Medication Sig Start Date End Date Taking? Authorizing Provider  fexofenadine-pseudoephedrine (ALLEGRA-D) 60-120 MG 12 hr tablet Take 1 tablet by mouth 2 (two) times daily. 03/02/21  Yes Joni Reining, PA-C  methylPREDNISolone (MEDROL DOSEPAK) 4 MG TBPK tablet Take Tapered dose as directed 03/02/21  Yes Joni Reining, PA-C  brompheniramine-pseudoephedrine-DM 30-2-10 MG/5ML syrup Take 10 mLs by mouth 4 (four) times daily as needed. 06/14/17   Cuthriell, Delorise Royals, PA-C  fluticasone (FLONASE) 50 MCG/ACT nasal spray Place 1 spray into both nostrils 2 (two) times daily. 06/14/17   Cuthriell, Delorise Royals, PA-C  pantoprazole (PROTONIX) 20 MG tablet Take 1 tablet (20 mg total) by mouth daily. 10/31/15 10/30/16  Jennye Moccasin, MD    Allergies Patient has no known allergies.  No family history on file.  Social History Social History   Tobacco Use   Smoking status: Never   Smokeless tobacco: Never  Substance Use Topics   Alcohol use: No   Drug use: No    Review of Systems Constitutional: No fever/chills Eyes: No visual changes. ENT: No sore throat.  Nasal congestion and bilateral ear pain. Cardiovascular: Denies chest  pain. Respiratory: Denies shortness of breath. Gastrointestinal: No abdominal pain.  No nausea, no vomiting.  No diarrhea.  No constipation. Genitourinary: Negative for dysuria. Musculoskeletal: Negative for back pain. Skin: Negative for rash. Neurological: Negative for headaches, focal weakness or numbness.   ____________________________________________   PHYSICAL EXAM:  VITAL SIGNS: ED Triage Vitals [03/02/21 1001]  Enc Vitals Group     BP 127/75     Pulse Rate 62     Resp 18     Temp 98.5 F (36.9 C)     Temp Source Oral     SpO2 100 %     Weight 135 lb (61.2 kg)     Height 5\' 2"  (1.575 m)     Head Circumference      Peak Flow      Pain Score      Pain Loc      Pain Edu?      Excl. in GC?    Constitutional: Alert and oriented. Well appearing and in no acute distress. Eyes: Conjunctivae are normal. PERRL. EOMI. Head: Atraumatic. Nose: Bilateral maxillary guarding.  Bilateral edematous nasal turbinates. EARS: Edematous but nonerythematous bilateral TM. Mouth/Throat: Mucous membranes are moist.  Oropharynx non-erythematous.  Postnasal drainage. Neck: No stridor.   Hematological/Lymphatic/Immunilogical: No cervical lymphadenopathy. Cardiovascular: Normal rate, regular rhythm. Grossly normal heart sounds.  Good peripheral circulation. Respiratory: Normal respiratory effort.  No retractions. Lungs CTAB. Gastrointestinal: Soft and nontender. No distention. No abdominal bruits. No CVA tenderness. Genitourinary: Deferred Neurologic:  Normal speech and language. No gross focal neurologic deficits  are appreciated. No gait instability. Skin:  Skin is warm, dry and intact. No rash noted. Psychiatric: Mood and affect are normal. Speech and behavior are normal.  ____________________________________________   LABS (all labs ordered are listed, but only abnormal results are displayed)  Labs Reviewed - No data to  display ____________________________________________  EKG   ____________________________________________  RADIOLOGY I, Joni Reining, personally viewed and evaluated these images (plain radiographs) as part of my medical decision making, as well as reviewing the written report by the radiologist.  ED MD interpretation:    Official radiology report(s): No results found.  ____________________________________________   PROCEDURES  Procedure(s) performed (including Critical Care):  Procedures   ____________________________________________   INITIAL IMPRESSION / ASSESSMENT AND PLAN / ED COURSE  As part of my medical decision making, I reviewed the following data within the electronic MEDICAL RECORD NUMBER         Patient presents with nasal congestion for 3 to 4 days.  Patient states bilateral ear pressure started yesterday.  Patient complaint physical exam consistent sinus congestion eustachian tube dysfunction.  Patient given discharge care instruction vies take medication as directed.  Patient [facility]     ____________________________________________   FINAL CLINICAL IMPRESSION(S) / ED DIAGNOSES  Final diagnoses:  Sinus congestion  Dysfunction of both eustachian tubes     ED Discharge Orders          Ordered    fexofenadine-pseudoephedrine (ALLEGRA-D) 60-120 MG 12 hr tablet  2 times daily        03/02/21 1048    methylPREDNISolone (MEDROL DOSEPAK) 4 MG TBPK tablet        03/02/21 1048             Note:  This document was prepared using Dragon voice recognition software and may include unintentional dictation errors.    Joni Reining, PA-C 03/02/21 1054    Sharyn Creamer, MD 03/02/21 [phone]

## 2021-03-02 NOTE — ED Triage Notes (Signed)
Pt c/o sinus congestion with right ear pain
